# Patient Record
Sex: Male | Born: 1995 | Race: Asian | Hispanic: No | Marital: Single | Smoking: Never smoker
Health system: Southern US, Community
[De-identification: ages and names within clinical notes are randomized; demographics above are authoritative.]

## PROBLEM LIST (undated history)

## (undated) DIAGNOSIS — T7840XA Allergy, unspecified, initial encounter: Secondary | ICD-10-CM

## (undated) HISTORY — DX: Allergy, unspecified, initial encounter: T78.40XA

---
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
## 2017-12-02 ENCOUNTER — Emergency Department (HOSPITAL_COMMUNITY)
Admission: EM | Admit: 2017-12-02 | Discharge: 2017-12-03 | Disposition: A | Discharge status: Home / Self Care | Payer: BLUE CROSS/BLUE SHIELD | Attending: Emergency Medicine | Admitting: Emergency Medicine

## 2017-12-02 DIAGNOSIS — X118XXA Contact with other hot tap-water, initial encounter: Secondary | ICD-10-CM | POA: Diagnosis not present

## 2017-12-02 DIAGNOSIS — T2010XA Burn of first degree of head, face, and neck, unspecified site, initial encounter: Secondary | ICD-10-CM | POA: Insufficient documentation

## 2017-12-02 DIAGNOSIS — Y929 Unspecified place or not applicable: Secondary | ICD-10-CM | POA: Insufficient documentation

## 2017-12-02 DIAGNOSIS — Y9389 Activity, other specified: Secondary | ICD-10-CM | POA: Insufficient documentation

## 2017-12-02 DIAGNOSIS — Z5321 Procedure and treatment not carried out due to patient leaving prior to being seen by health care provider: Secondary | ICD-10-CM | POA: Diagnosis not present

## 2017-12-02 DIAGNOSIS — Y998 Other external cause status: Secondary | ICD-10-CM | POA: Diagnosis not present

## 2017-12-03 ENCOUNTER — Encounter (HOSPITAL_COMMUNITY): Payer: Self-pay | Admitting: *Deleted

## 2017-12-03 ENCOUNTER — Other Ambulatory Visit: Payer: Self-pay

## 2017-12-03 NOTE — ED Notes (Signed)
Pt let staff know that they are leaving, due to the wait. Pt encouraged to stay and be seen by a provider, pt still decided to leave.
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
## 2017-12-05 ENCOUNTER — Emergency Department (HOSPITAL_COMMUNITY)
Admission: EM | Admit: 2017-12-05 | Discharge: 2017-12-05 | Disposition: A | Discharge status: Home / Self Care | Payer: BLUE CROSS/BLUE SHIELD | Attending: Emergency Medicine | Admitting: Emergency Medicine

## 2017-12-05 ENCOUNTER — Encounter (HOSPITAL_COMMUNITY): Payer: Self-pay

## 2017-12-05 ENCOUNTER — Other Ambulatory Visit: Payer: Self-pay

## 2017-12-05 DIAGNOSIS — T2121XA Burn of second degree of chest wall, initial encounter: Secondary | ICD-10-CM | POA: Insufficient documentation

## 2017-12-05 DIAGNOSIS — Y9389 Activity, other specified: Secondary | ICD-10-CM | POA: Diagnosis not present

## 2017-12-05 DIAGNOSIS — X12XXXA Contact with other hot fluids, initial encounter: Secondary | ICD-10-CM | POA: Insufficient documentation

## 2017-12-05 DIAGNOSIS — Y929 Unspecified place or not applicable: Secondary | ICD-10-CM | POA: Diagnosis not present

## 2017-12-05 DIAGNOSIS — Z23 Encounter for immunization: Secondary | ICD-10-CM | POA: Diagnosis not present

## 2017-12-05 DIAGNOSIS — T310 Burns involving less than 10% of body surface: Secondary | ICD-10-CM | POA: Insufficient documentation

## 2017-12-05 DIAGNOSIS — T2027XA Burn of second degree of neck, initial encounter: Secondary | ICD-10-CM | POA: Diagnosis not present

## 2017-12-05 DIAGNOSIS — T2006XA Burn of unspecified degree of forehead and cheek, initial encounter: Secondary | ICD-10-CM | POA: Diagnosis present

## 2017-12-05 DIAGNOSIS — T2020XA Burn of second degree of head, face, and neck, unspecified site, initial encounter: Secondary | ICD-10-CM

## 2017-12-05 DIAGNOSIS — Y999 Unspecified external cause status: Secondary | ICD-10-CM | POA: Insufficient documentation

## 2017-12-05 DIAGNOSIS — T2026XA Burn of second degree of forehead and cheek, initial encounter: Secondary | ICD-10-CM | POA: Insufficient documentation

## 2017-12-05 MED ORDER — BACITRACIN ZINC 500 UNIT/GM EX OINT
TOPICAL_OINTMENT | Freq: Once | CUTANEOUS | Status: AC
  Administered 2017-12-05: 1 via TOPICAL
  Filled 2017-12-05: qty 0.9

## 2017-12-05 MED ORDER — BACITRACIN ZINC 500 UNIT/GM EX OINT: 1.0000 "application " | TOPICAL_OINTMENT | Freq: Two times a day (BID) | CUTANEOUS | 0 refills | Status: AC

## 2017-12-05 MED ORDER — TETANUS-DIPHTH-ACELL PERTUSSIS 5-2.5-18.5 LF-MCG/0.5 IM SUSP
0.5000 mL | Freq: Once | INTRAMUSCULAR | Status: AC
  Administered 2017-12-05: 0.5 mL via INTRAMUSCULAR
  Filled 2017-12-05: qty 0.5
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
